# Patient Record
Sex: Male | Born: 1970 | Race: White | Hispanic: No | Marital: Married
Health system: Western US, Academic
[De-identification: ages and names within clinical notes are randomized; demographics above are authoritative.]

## PROBLEM LIST (undated history)

## (undated) DIAGNOSIS — I4891 Unspecified atrial fibrillation: Secondary | ICD-10-CM

## (undated) HISTORY — PX: ATRIAL ABLATION SURGERY: SHX560

---

## 2016-01-19 ENCOUNTER — Emergency Department
Admission: EM | Admit: 2016-01-19 | Discharge: 2016-01-19 | Disposition: A | Discharge status: Home / Self Care | Payer: Medicaid Other | Attending: Emergency Medicine | Admitting: Emergency Medicine

## 2016-01-19 ENCOUNTER — Encounter (HOSPITAL_COMMUNITY): Payer: Self-pay | Admitting: Emergency Medicine

## 2016-01-19 DIAGNOSIS — M7989 Other specified soft tissue disorders: Secondary | ICD-10-CM

## 2016-01-19 DIAGNOSIS — Y9289 Other specified places as the place of occurrence of the external cause: Secondary | ICD-10-CM | POA: Insufficient documentation

## 2016-01-19 DIAGNOSIS — M542 Cervicalgia: Principal | ICD-10-CM | POA: Insufficient documentation

## 2016-01-19 DIAGNOSIS — I878 Other specified disorders of veins: Secondary | ICD-10-CM

## 2016-01-19 DIAGNOSIS — T07 Unspecified multiple injuries: Secondary | ICD-10-CM

## 2016-01-19 DIAGNOSIS — Y9355 Activity, bike riding: Secondary | ICD-10-CM | POA: Insufficient documentation

## 2016-01-19 DIAGNOSIS — S70212A Abrasion, left hip, initial encounter: Secondary | ICD-10-CM | POA: Insufficient documentation

## 2016-01-19 DIAGNOSIS — S50312A Abrasion of left elbow, initial encounter: Secondary | ICD-10-CM | POA: Insufficient documentation

## 2016-01-19 DIAGNOSIS — M25522 Pain in left elbow: Secondary | ICD-10-CM

## 2016-01-19 DIAGNOSIS — M25552 Pain in left hip: Secondary | ICD-10-CM

## 2016-01-19 DIAGNOSIS — S40212A Abrasion of left shoulder, initial encounter: Secondary | ICD-10-CM | POA: Insufficient documentation

## 2016-01-19 DIAGNOSIS — Y998 Other external cause status: Secondary | ICD-10-CM | POA: Insufficient documentation

## 2016-01-19 DIAGNOSIS — M25512 Pain in left shoulder: Secondary | ICD-10-CM

## 2016-01-19 DIAGNOSIS — T148XXA Other injury of unspecified body region, initial encounter: Secondary | ICD-10-CM

## 2016-01-19 HISTORY — DX: Unspecified atrial fibrillation (CMS-HCC): I48.91

## 2016-01-19 MED ORDER — BACITRACIN 500 UNIT/GM EX OINT
TOPICAL_OINTMENT | Freq: Once | CUTANEOUS | Status: AC
Start: 2016-01-19 — End: 2016-01-19
  Administered 2016-01-19: 10:00:00 via TOPICAL
  Filled 2016-01-19: qty 2.7

## 2016-01-19 MED ORDER — KETOROLAC TROMETHAMINE 30 MG/ML IJ SOLN
30.0000 mg | Freq: Once | INTRAMUSCULAR | Status: DC
Start: 2016-01-19 — End: 2016-01-19

## 2016-01-19 MED ORDER — LACTATED RINGERS IV SOLN
Freq: Once | INTRAVENOUS | Status: AC
Start: 2016-01-19 — End: 2016-01-19
  Administered 2016-01-19: 10:00:00 via INTRAVENOUS
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
## 2016-01-19 NOTE — ED MD Progress Note (Signed)
ED Course:  Vital stable throughout the time in the emergency department.  Patient reports that his pain was also stable and he declined repeated offers for analgesia.  X-ray without evidence of acute fracture or dislocation.  Therefore discharged strict return precautions and plan to follow up with primary doctor in the coming week.  There was evidence of a small anterior effusion on his x-ray this elbow thus placed in arm sling for comfort but told to range joint as tolerated immediately.    Lab Results:  No results found for this or any previous visit.    Imaging Results:  X-ray Shoulder Complete Min 2 Views - Left - 01/19/2016 - IMPRESSION: No radiographic evidence of acute fracture or malalignment of the left shoulder.    X-ray Elbow 2 Views - Left - 01/19/2016 - IMPRESSION: No radiographic evidence of acute fracture or malalignment of the left elbow.    X-ray Hip Unilateral With Pelvis 2 Or 3 Views - Left - 01/19/2016 - IMPRESSION: No acute osseous abnormality.      All labs and study results were reviewed by myself and the Attending. Pertinent findings were discussed with the patient and his questions were answered. He agrees with this plan of care.

## 2016-01-19 NOTE — Discharge Instructions (Signed)
Arthralgia    You have been diagnosed with Arthralgia.    Arthralgia means pain and stiffness of the joints. People often describe the pain as aching or throbbing. Arthralgia can affect one or more joints. It can be caused by many types of conditions and/or injuries. Often, arthralgia lasts for a long time and people need treatment over months or years. Some causes of arthralgia are:     Infection with a virus.   Many types of infections that are starting to improve. When recovering from infection, sometimes there is joint pain.    Autoimmune diseases (where the body attacks itself). Examples are Lupus or Rheumatoid Arthritis.   Inflammation of the tendons or the fluid-filled sacs (bursa) surrounding your joints.   Low thyroid function.   Depression.     You might need another exam or more tests to find out why you have arthralgias. At this time, the cause of your symptoms does not seem dangerous. You do not need to stay in the hospital.    We dont believe your condition is dangerous right now. However, you need to be careful. Sometimes a problem that seems small can get serious later. This is why it is very important to come back here or go to the nearest Emergency Department unless you are much improved.    Clues that joint pain is dangerous are:     Hot and swollen joints. This may mean they are infected.   Fever (Temperature higher than 100.13F or 38C), weight loss and feeling very ill can be symptoms of severe infection (sepsis).   Severe pain, weakness or numbness (loss of feeling).    Some things you can try at home are:     Over-the-counter pain medications.   Heating pads and warm baths.   Physical therapy.    Follow the instructions for any medication you get prescribed.     Have a close follow-up with your primary care doctor.    YOU SHOULD SEEK MEDICAL ATTENTION IMMEDIATELY, EITHER HERE OR AT THE NEAREST EMERGENCY DEPARTMENT, IF ANY OF THE FOLLOWING OCCURS:     You have a  fever (temperature higher than 100.13F or 38C).   Your pain does not go away or gets worse.   The joint that hurts turns red and/or gets swollen.   You suddenly can't walk.   You don't feel better after treatment or feel you're getting worse.   You get any other symptoms, concerns, or don't get better as expected.    If you can't follow up with your doctor, or if at any time you feel you need to be rechecked or seen again, come back here or go to the nearest emergency department.        Abrasion    You have been diagnosed with an abrasion. This is a scrape of the outer skin layers.    Take off old dressings every day. Then put on a clean, dry dressing. If the dressing sticks to the wound, moisten it with water. This way, it can come off more easily.    Keep the wound clean and dry for the next 24 hours. You can wash the wound gently with soap and water. Then put on a dry bandage if needed, to protect it.    Put a thin layer of antibiotic ointment on the wound 2-3 times a day. This can be Polysporin / triple antibiotic. This can help prevent infection. It may help keep scarring to a minimum.  YOU SHOULD SEEK MEDICAL ATTENTION IMMEDIATELY, EITHER HERE OR AT THE NEAREST EMERGENCY DEPARTMENT, IF ANY OF THE FOLLOWING OCCURS:  · Unusual redness or swelling.  · There are red streaks going up the arm or leg.  · The wound smells bad or has a lot of drainage.  · Fever (temperature higher than 100.4ºF / 38ºC), chills, more pain and / or swelling.

## 2016-01-27 NOTE — ED Follow-up Note (Signed)
Follow-up type: Callback       Routine ED Patient Call Back    Patient unable to be contacted, no message left

## 2023-02-17 IMAGING — MR MRI THORACIC SPINE W/WO CONTRAST
7 of 13 series · 20 of 48 positions shown · IV contrast (gadolinium)
Comparison: None.

________________________________________________________________________________________________ 
MRI THORACIC SPINE W/WO CONTRAST, 02/17/2023 [DATE]: 
CLINICAL INDICATION: Thoracic spine pain
TECHNIQUE: Multiplanar, multiecho position MR images of the thoracic spine were 
performed without and with intravenous gadolinium enhancement. 8.5 mL of 
Gadavistwere injected intravenously by hand. 1.5 mL of Gadavist discarded. 
Patient was scanned on a 3T magnet.

[Series 102: T1 · sagittal · 5.5mm · 0.87mm/px · 1 of 15 slices shown]
[im 1/15]
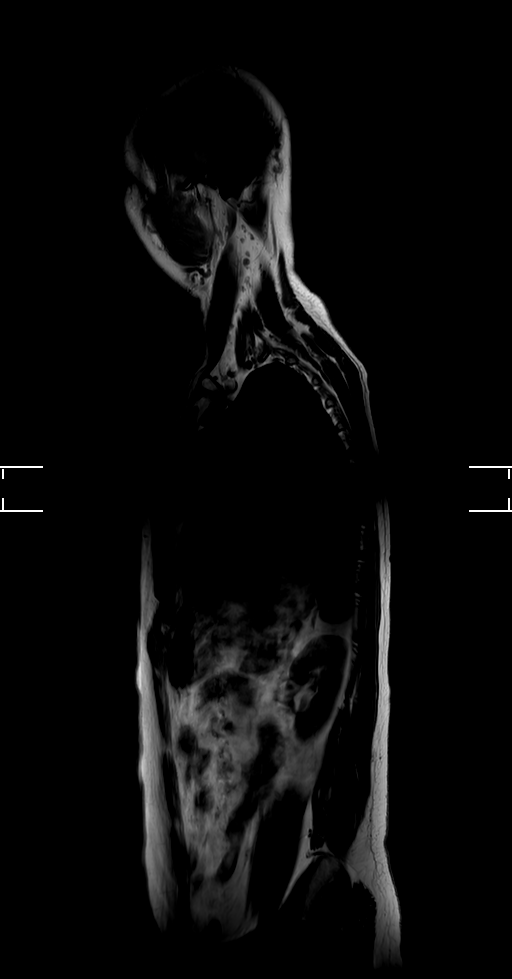

[Series 201: t2w_tse cor · coronal · 6.0mm · 0.51mm/px · 1 of 11 slices shown]
[im 1/11]
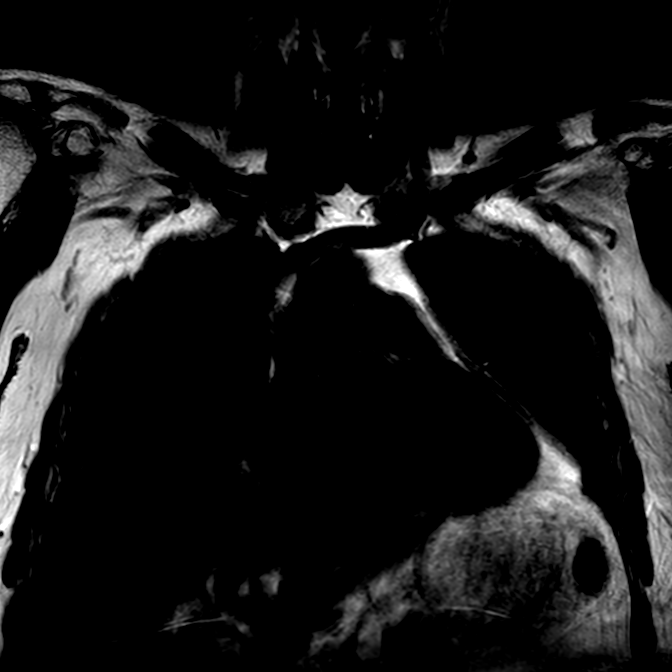

[Series 301: t1w_tse sag · sagittal · 3.0mm · 0.55mm/px · 2 of 21 slices shown]
[im 1/21]
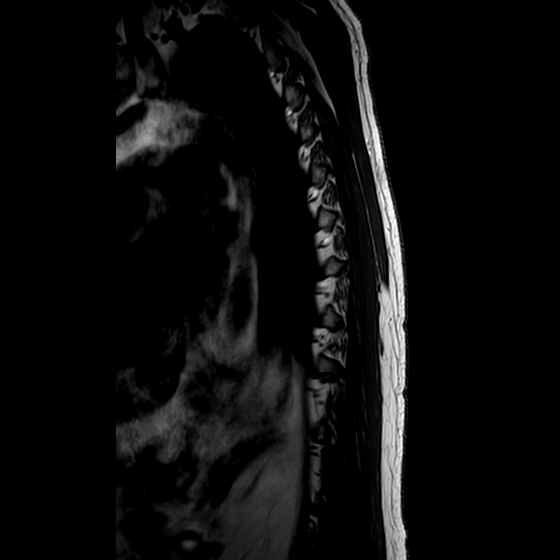
[im 21/21]
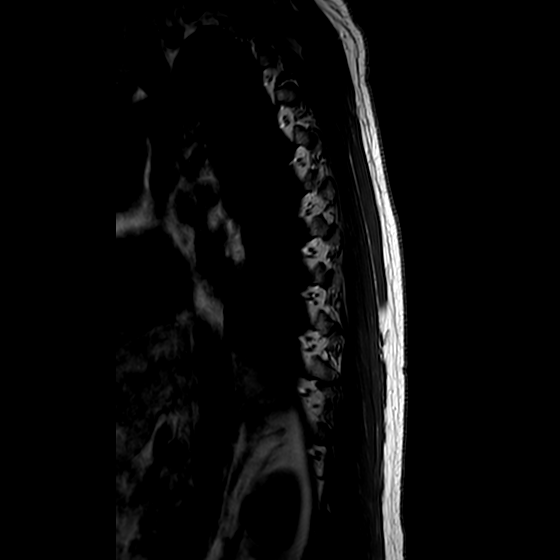

[Series 401: t2w_tse sag · sagittal · 3.0mm · 0.60mm/px · 2 of 21 slices shown]
[im 1/21]
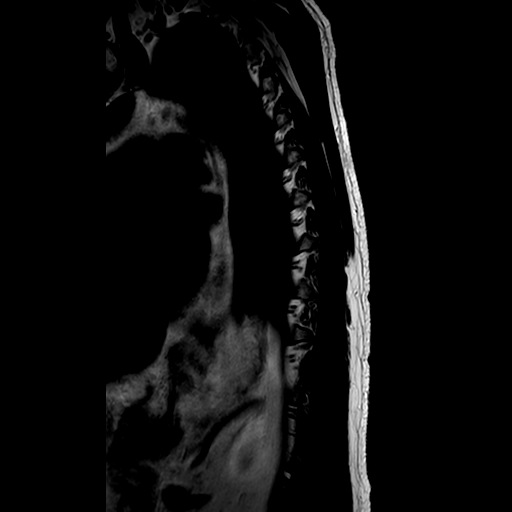
[im 21/21]
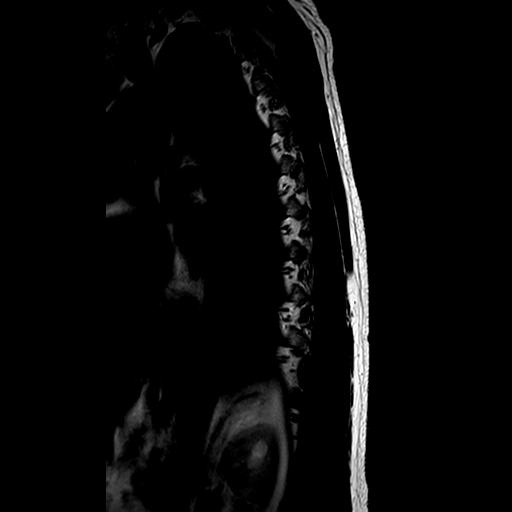

[Series 501: stir_longte · sagittal · 3.0mm · 0.71mm/px · 2 of 21 slices shown]
[im 1/21]
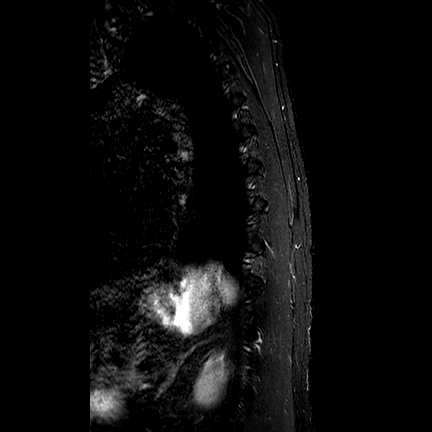
[im 21/21]
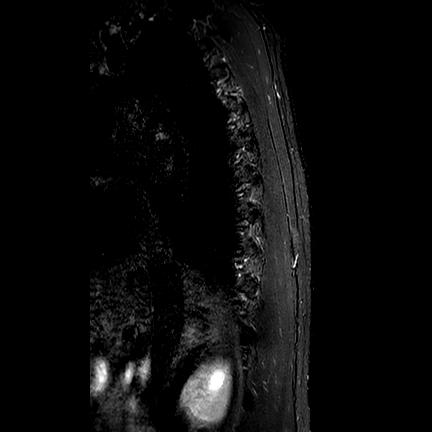

[Series 601: 3d_spine_view_t2 sag · sagittal · 1.4mm · 0.64mm/px · 7 of 100 slices shown]
[im 1/100]
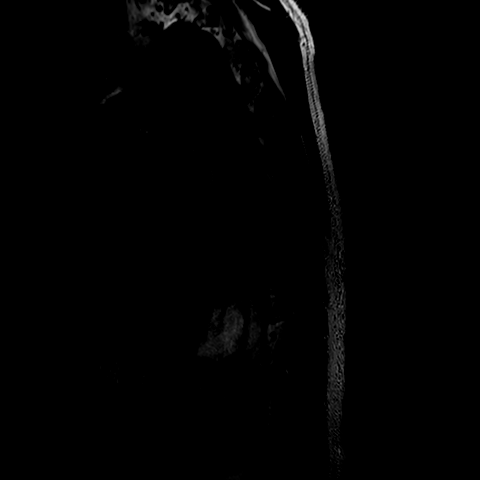
[im 15/100]
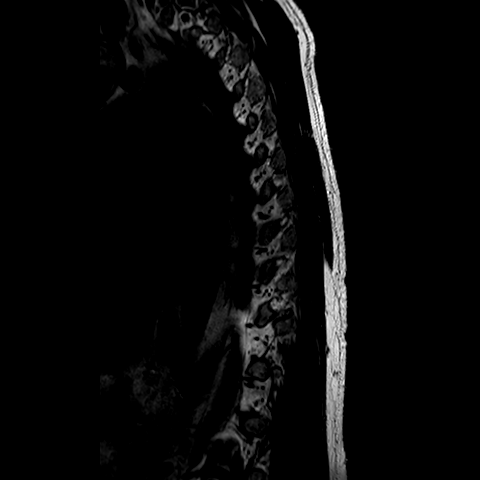
[im 29/100]
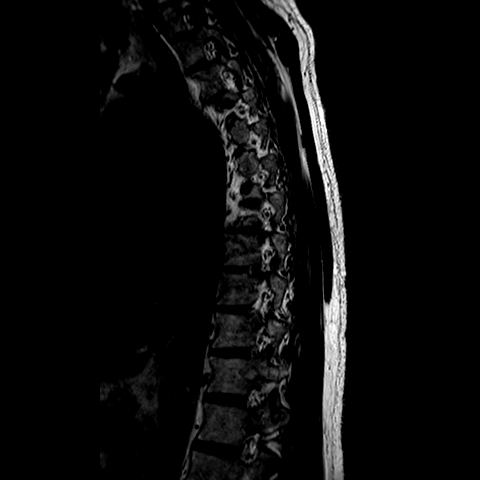
[im 43/100]
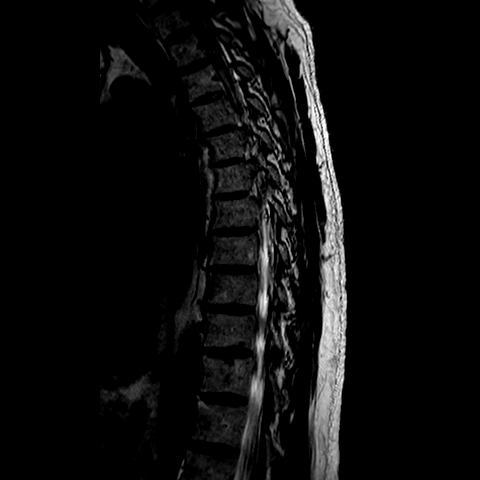
[im 57/100]
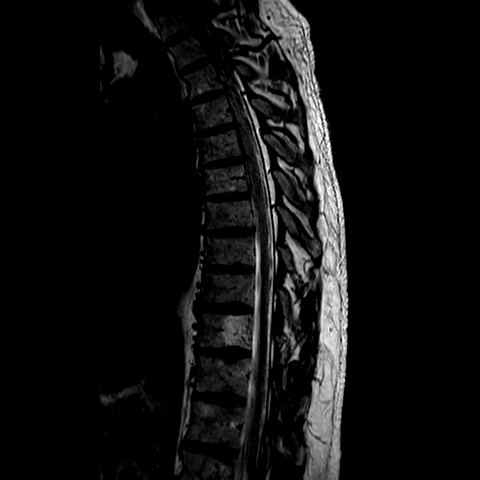
[im 71/100]
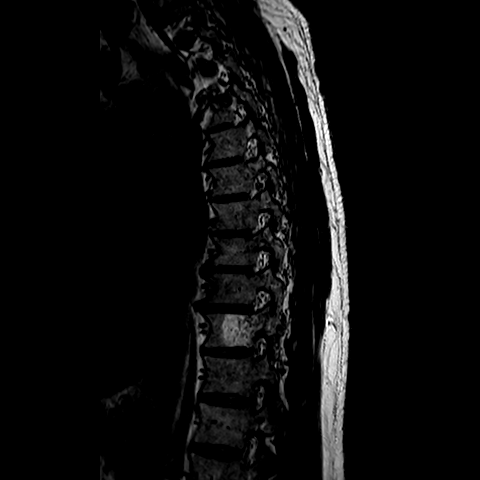
[im 85/100]
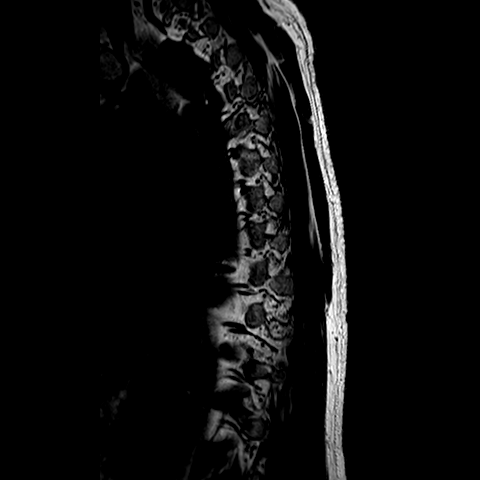

[Series 703: T2 · axial · 4.0mm · 0.42mm/px · z∈[-272,-3]mm · 5 of 66 slices shown]
[im 1/66]
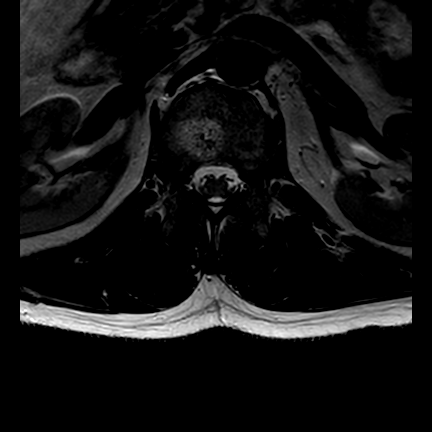
[im 17/66]
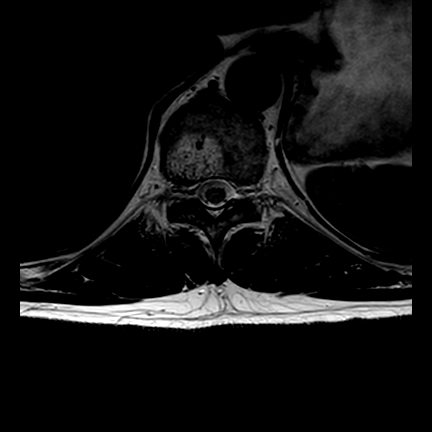
[im 33/66]
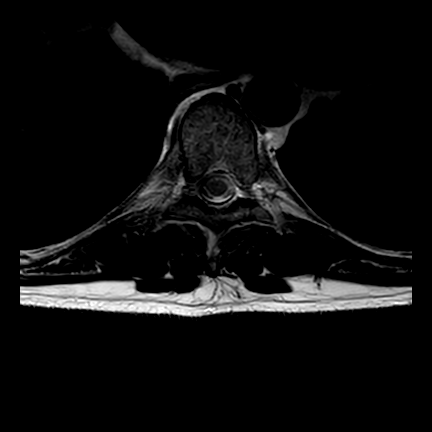
[im 49/66]
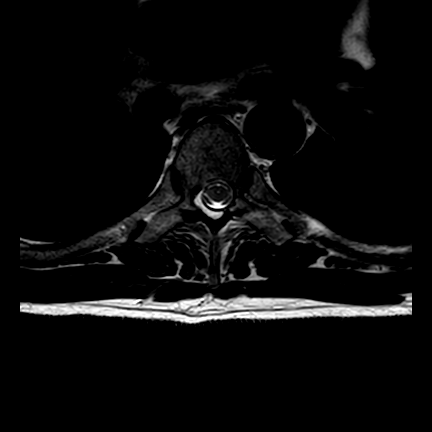
[im 66/66]
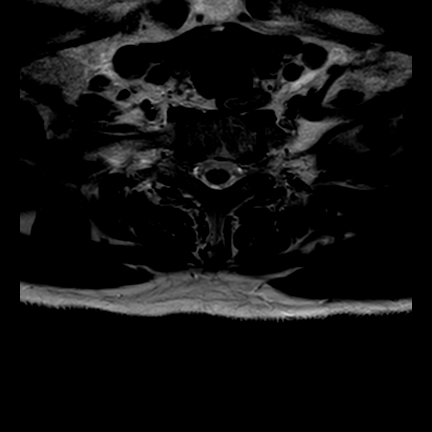

[20 of 48 positions shown; findings below may reference images not displayed]

FINDINGS: VERTEBRA: T10 vertebral body and right posterior element (pedicle, articulating 
processes, transverse process and portion of the lamina) hemangioma measures up 
to 5.0 cm in greatest dimensions, with posterior element marrow 
edema/enhancement and adjacent soft tissue swelling. Findings are consistent 
with stress reaction of the right T10 posterior elements and subtle nondisplaced 
fracture cannot be excluded. Multilevel marginal osteophytes. No central canal 
or neural foraminal stenosis. 12 degrees mid thoracic dextroscoliosis. 
DISCS: Multilevel degenerative disc disease. No disc protrusions or evidence of 
discitis.  
SPINAL CORD: Spinal cord is normal in caliber and signal intensity. Conus 
medullaris is at the level of L1.  
OTHER: Localizer views demonstrate mild degenerative change of the lumbar spine 
and 2.2 cm L1 vertebral body hemangioma. 1.2 cm right renal cyst.
IMPRESSION: 1.  T10 vertebral body and right posterior element hemangioma measures up to
cm, with posterior element marrow edema/enhancement and adjacent soft tissue 
swelling. Findings are consistent with stress reaction of the right T10 
posterior elements and subtle nondisplaced fracture cannot be excluded: CT of 
the thoracic spine without contrast may be helpful for further evaluation, if 
clinically indicated.  
2.  Multilevel degenerative change and 12 degrees mid thoracic dextroscoliosis.

## 2023-03-03 IMAGING — MR MRI RIGHT HAND WITHOUT CONTRAST
4 of 6 series · 20 of 40 positions shown · IV contrast (gadolinium)
Comparison: None.

________________________________________________________________________________________________ 
MRI RIGHT HAND WITHOUT CONTRAST, 03/03/2023 [DATE]: 
CLINICAL INDICATION: Pain In Right Wrist
TECHNIQUE: Multiplanar, multiecho position MR images of the hand were performed 
without intravenous gadolinium enhancement. Patient was scanned on a 3T magnet.

[Series 301: PD fat-sat · axial · right · 3.0mm · 0.27mm/px · z∈[-92,+76]mm · 8 of 49 slices shown (1 of 3)]
[im 1/49]
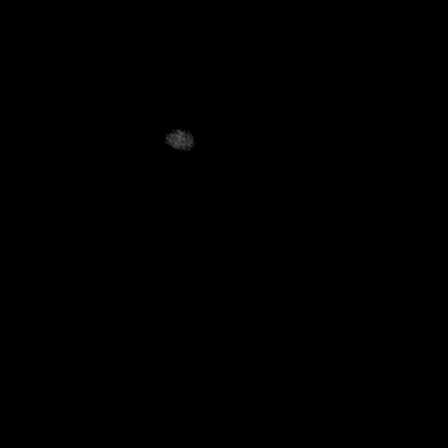
[im 6/49]
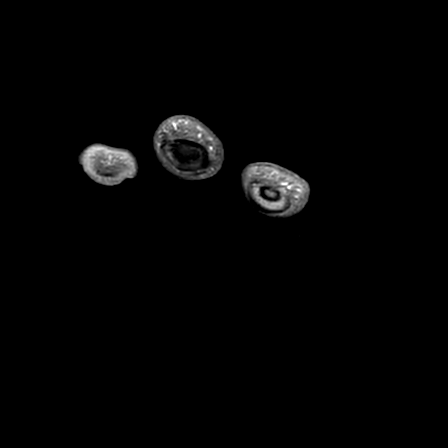
[im 17/49]
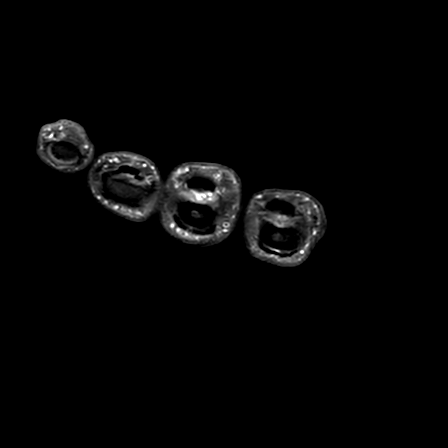
[im 22/49]
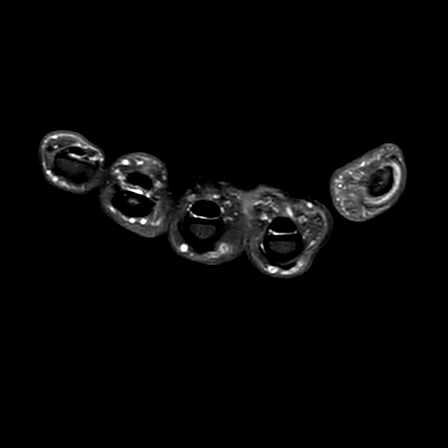
[im 27/49]
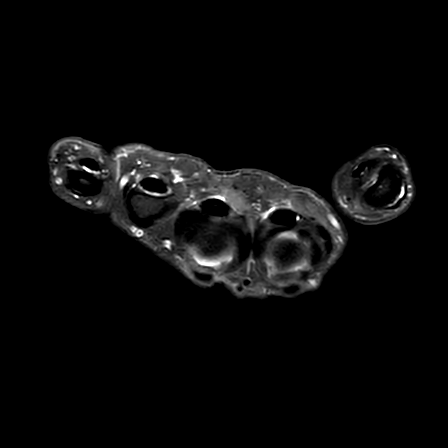
[im 33/49]
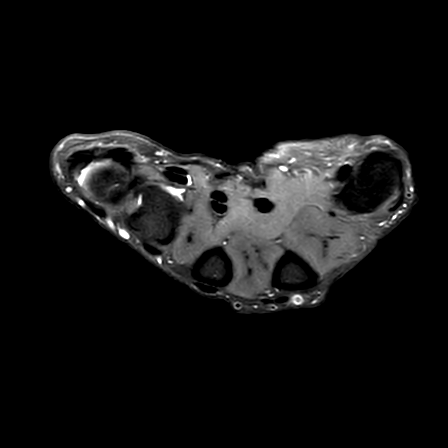
[im 43/49]
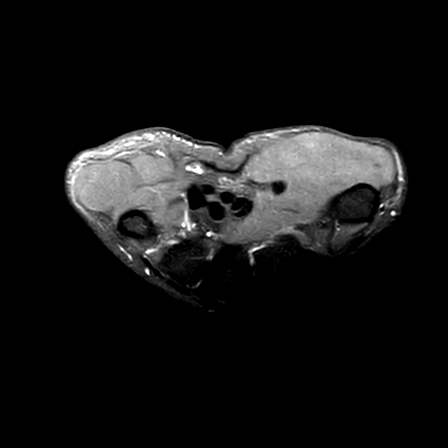
[im 49/49]
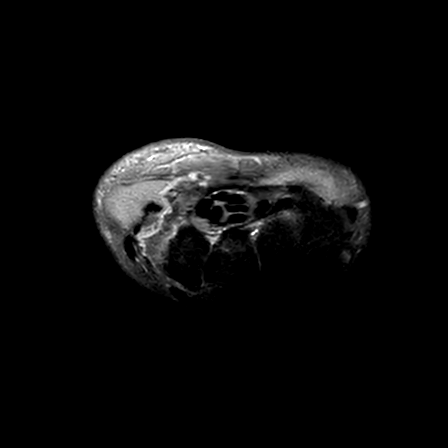

[Series 401: T1 · axial · right · 3.0mm · 0.21mm/px · z∈[-74,+55]mm · 3 of 49 slices shown]
[im 6/49]
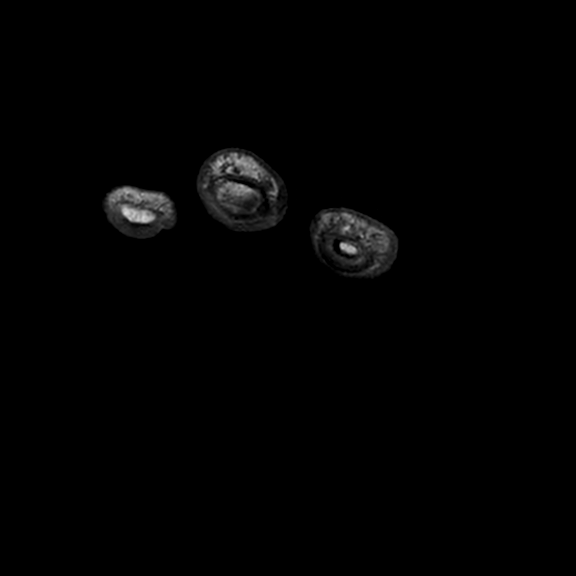
[im 27/49]
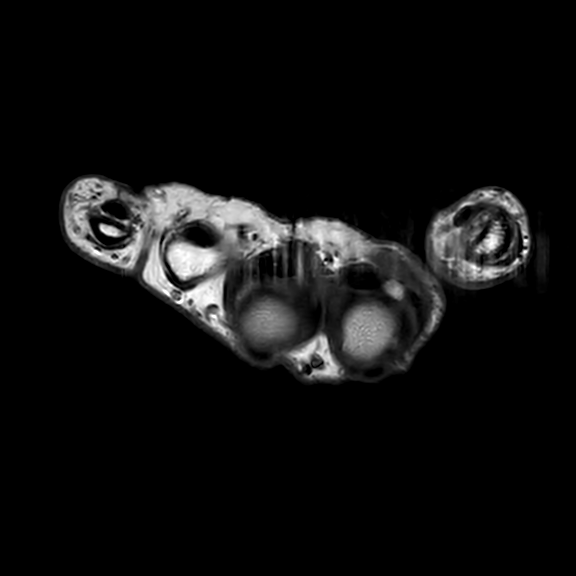
[im 43/49]
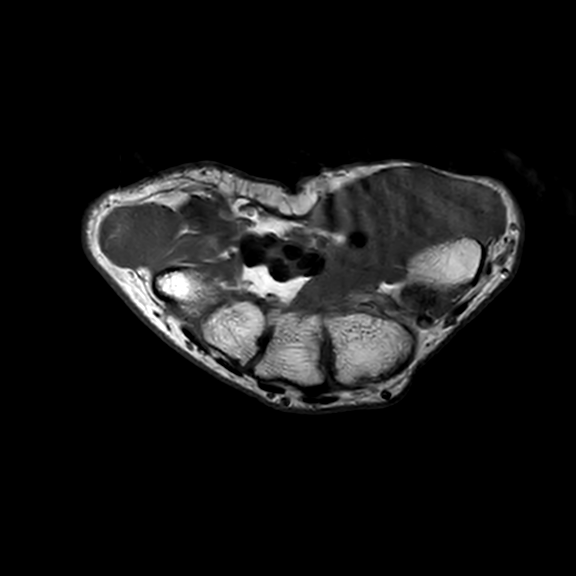

[Series 601: PD fat-sat · coronal · right · 2.0mm · 0.33mm/px · 6 of 29 slices shown (2 of 3)]
[im 1/29]
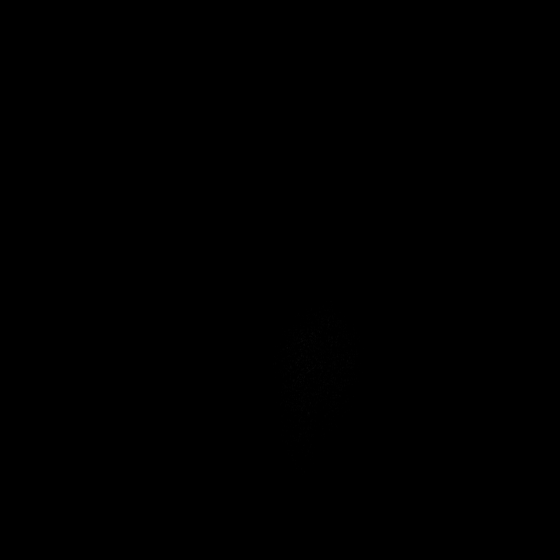
[im 6/29]
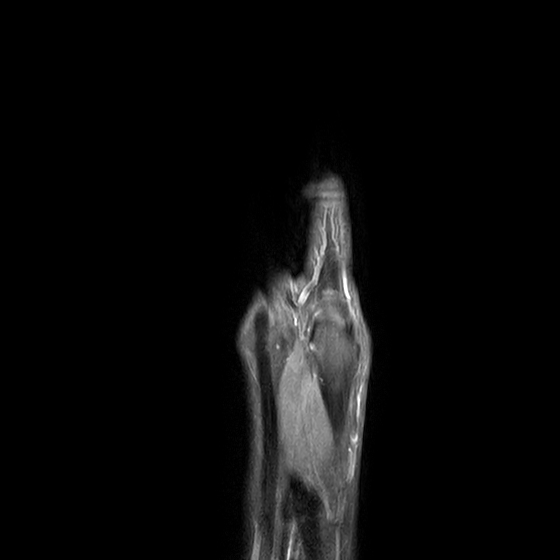
[im 12/29]
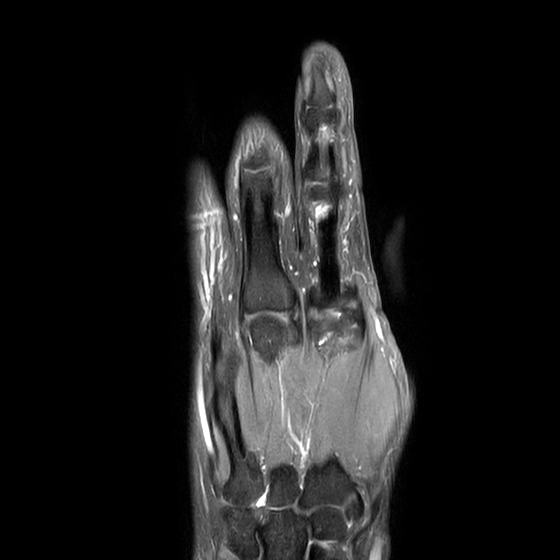
[im 17/29]
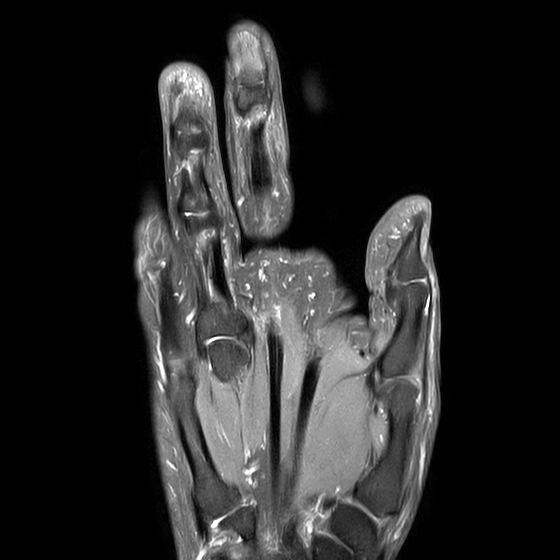
[im 23/29]
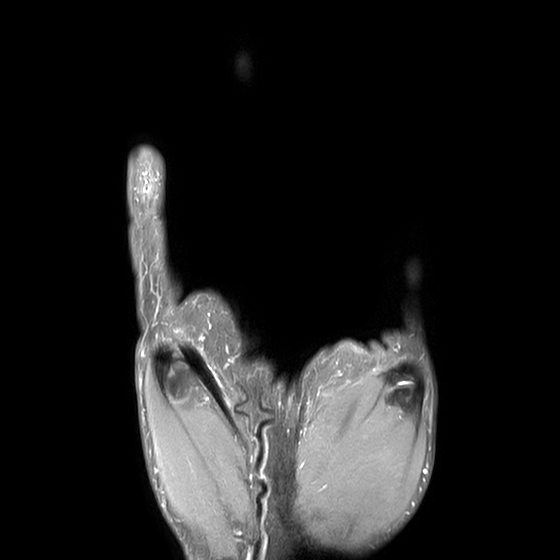
[im 29/29]
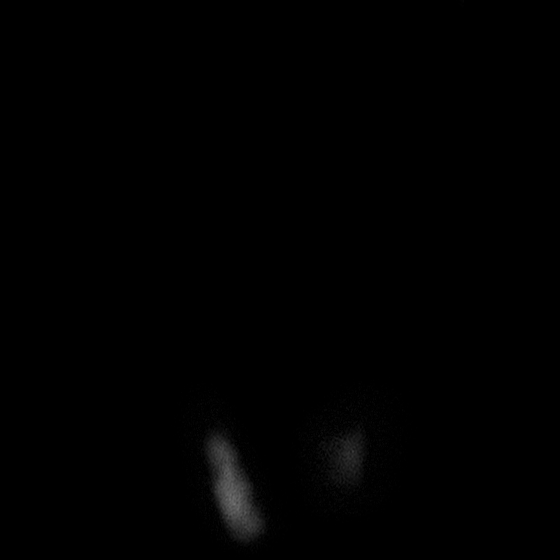

[Series 701: PD fat-sat · sagittal · right · 3.0mm · 0.33mm/px · 3 of 33 slices shown (3 of 3)]
[im 7/33]
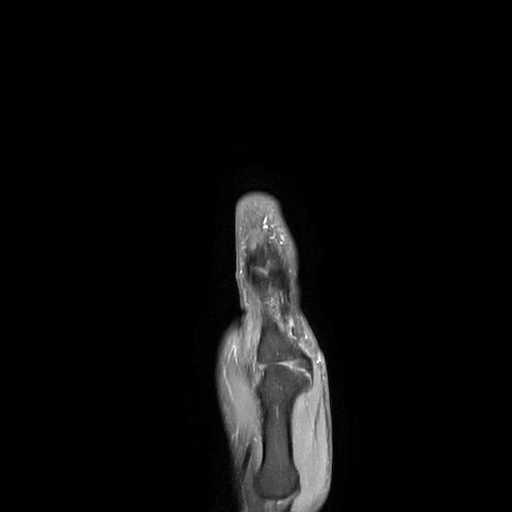
[im 20/33]
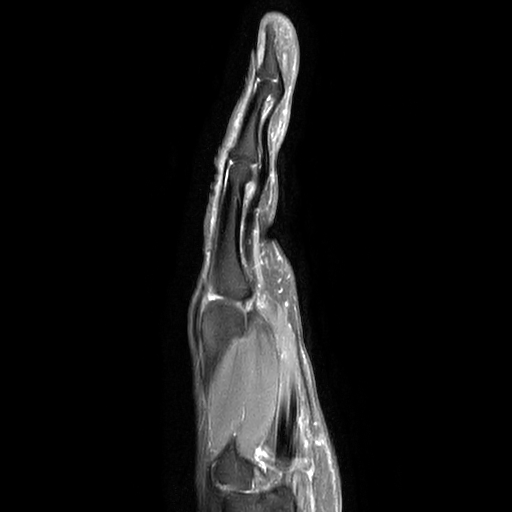
[im 33/33]
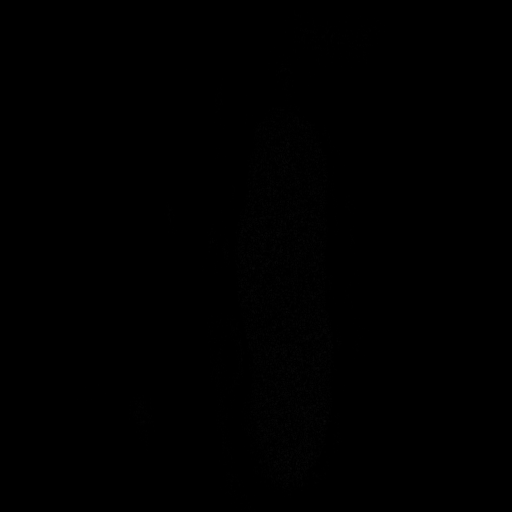

[20 of 40 positions shown; findings below may reference images not displayed]

FINDINGS: BONES AND JOINTS: Normal bone marrow signal intensity. No marrow edema, fracture 
or stress response. Articular cartilage is preserved. No joint effusion. No 
discrete synovitis.  
TENDONS: The imaged flexor and extensor tendons are preserved. No tendon tear, 
tenosynovitis or tendon subluxation. Annular pulleys, volar plates and extensor 
hoods appear preserved. 
SOFT TISSUES: Musculature is symmetric without mass, signal abnormality or 
atrophy. Neurovascular bundles are negative. No focal fluid collection or 
suggestion of ganglion.
IMPRESSION: Negative right hand. Please see MR exam the same day of the right wrist for 
discussion of the incomplete radial styloid fracture.

## 2023-03-03 IMAGING — MR MRI RIGHT WRIST WITHOUT CONTRAST
4 of 6 series · 17 of 40 positions shown · IV contrast (gadolinium)
Comparison: None.

________________________________________________________________________________________________ 
MRI RIGHT WRIST WITHOUT CONTRAST, 03/03/2023 [DATE]: 
CLINICAL INDICATION: Pain in the right wrist. History of right hand pain, thumb 
area and right wrist pain.
TECHNIQUE: Multiplanar, multiecho position MR images of the wrist were performed 
without intravenous gadolinium enhancement. Patient was scanned on a 3T magnet.

[Series 201: survey_right · coronal · 5.0mm · 0.89mm/px · 3 of 14 slices shown]
[im 1/14]
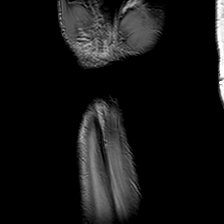
[im 7/14]
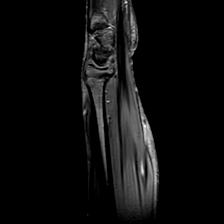
[im 14/14]
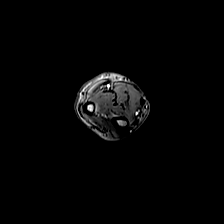

[Series 501: PD fat-sat · coronal · 2.0mm · 0.19mm/px · 7 of 25 slices shown (1 of 2)]
[im 1/25]
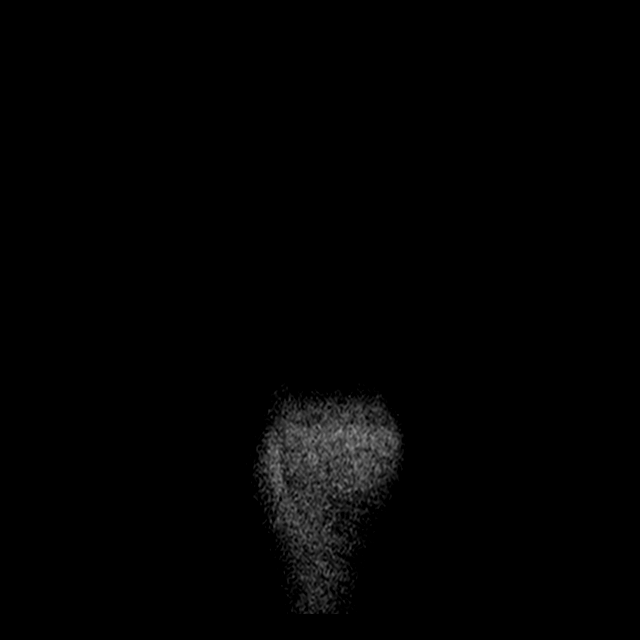
[im 5/25]
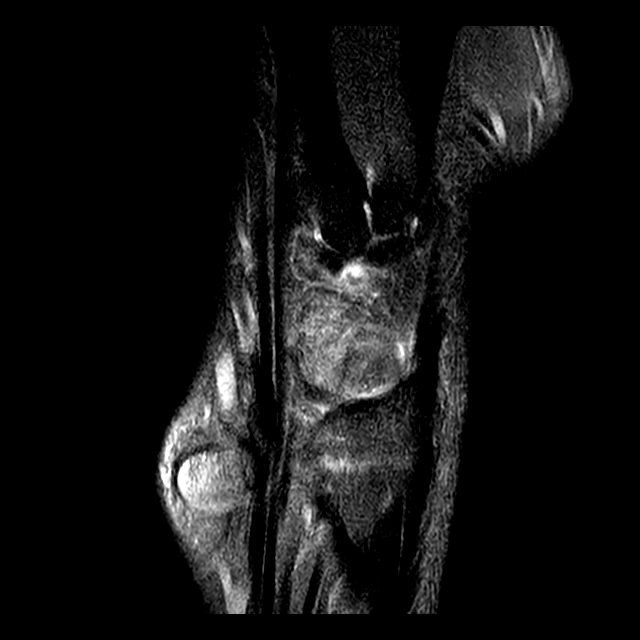
[im 9/25]
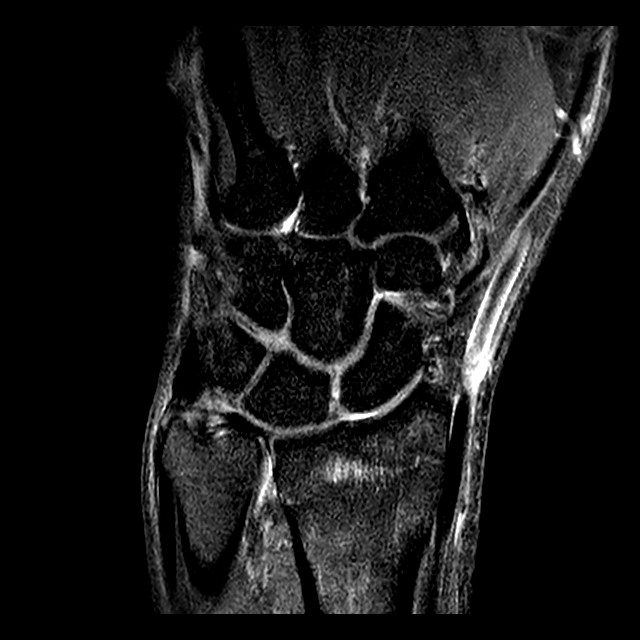
[im 13/25]
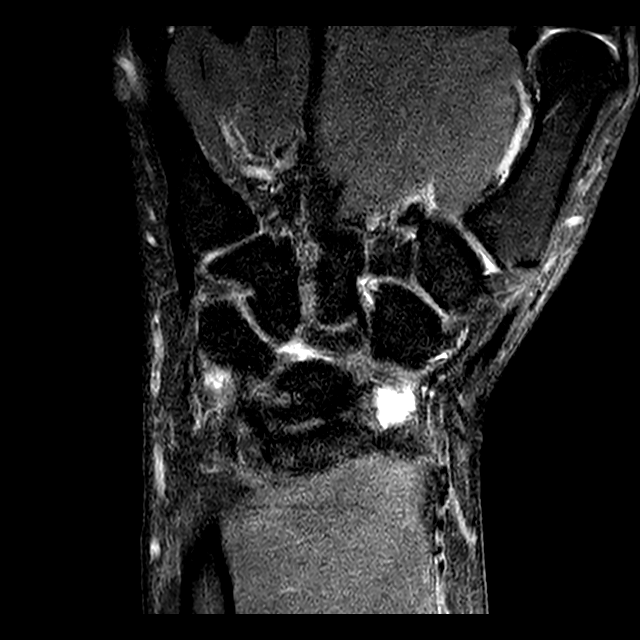
[im 17/25]
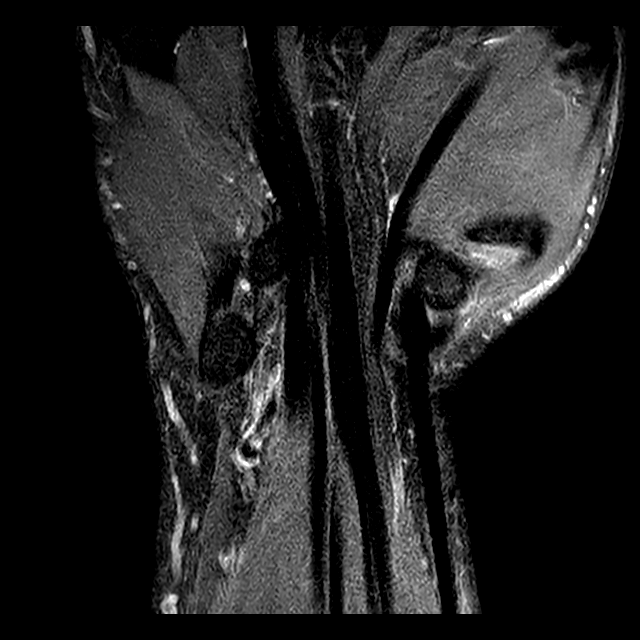
[im 21/25]
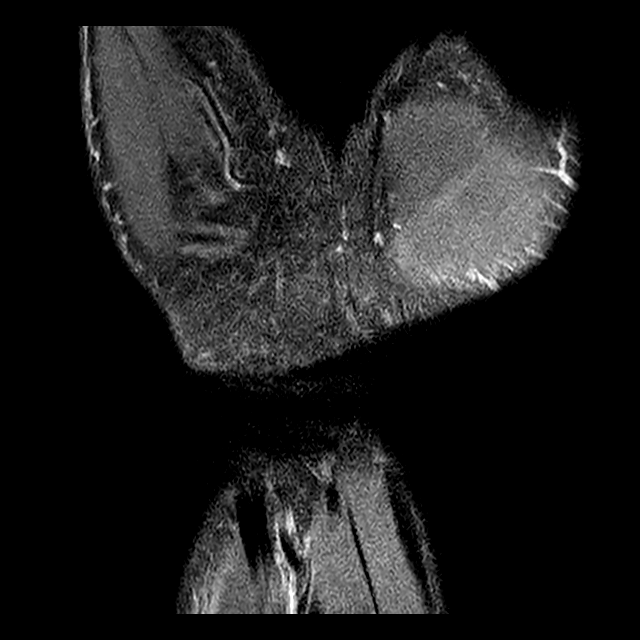
[im 25/25]
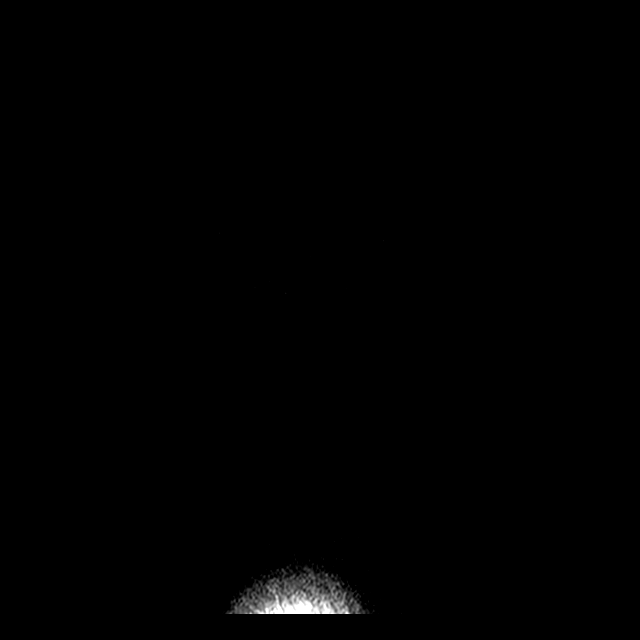

[Series 601: T1 · coronal · 2.0mm · 0.21mm/px · 3 of 25 slices shown]
[im 5/25]
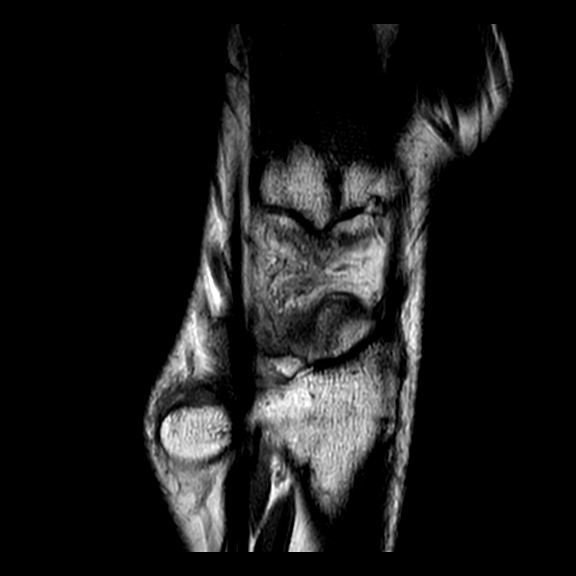
[im 13/25]
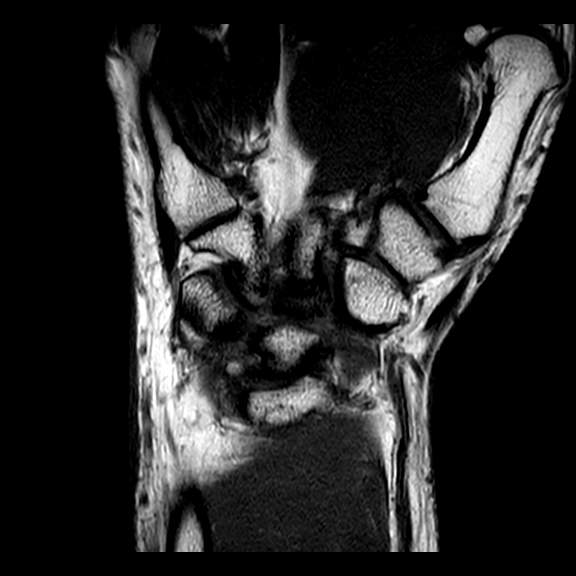
[im 21/25]
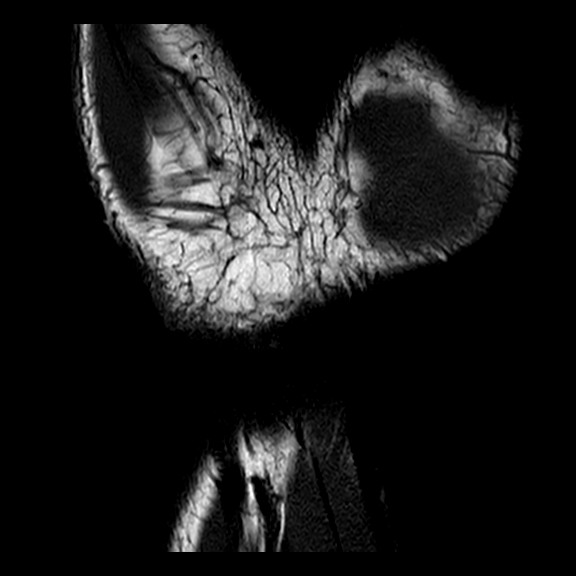

[Series 701: PD fat-sat · sagittal · 3.0mm · 0.23mm/px · 4 of 31 slices shown (2 of 2)]
[im 1/31]
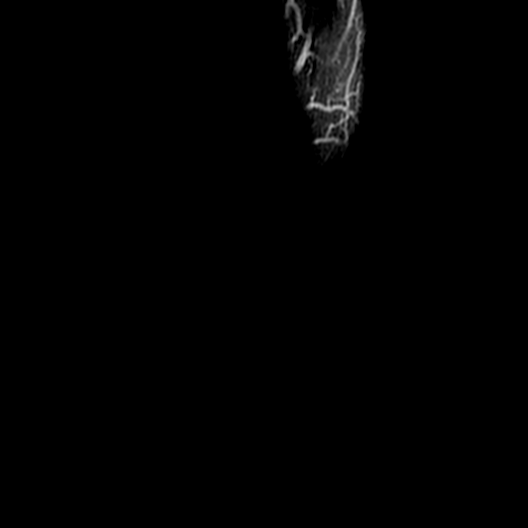
[im 4/31]
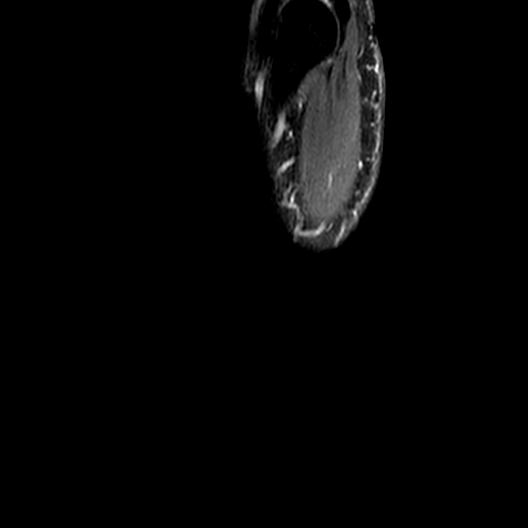
[im 16/31]
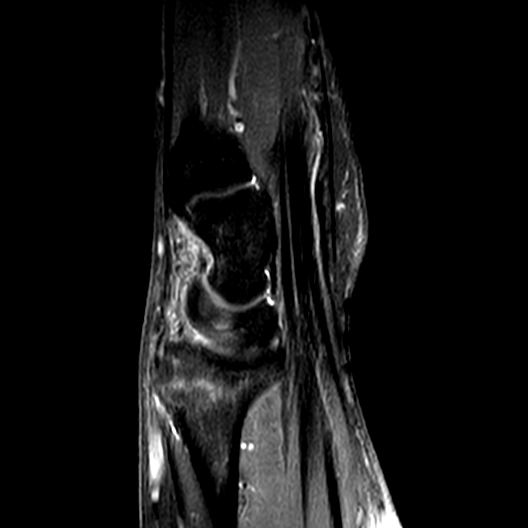
[im 27/31]
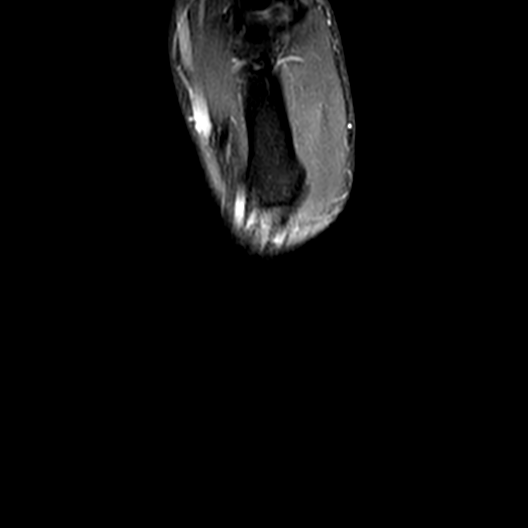

[17 of 40 positions shown; findings below may reference images not displayed]

FINDINGS: BONES AND JOINTS: There is marrow edema with transverse incomplete fracture of 
the radial styloid. No additional fracture. Mild scattered articular 
cartilaginous loss. Small effusion. Normal alignment. Specifically, the distal 
ulna as well located within the radial sigmoid notch. 
LIGAMENTS: The scapholunate ligament is preserved. The lunotriquetral ligament 
is preserved. The central disc of the TFCC is preserved. The volar and dorsal 
components of the radioulnar ligament of the TFCC are intact. The ulnocarpal 
components of the TFCC are intact. The extrinsic ligaments are grossly intact.  
TENDONS: The flexor and extensor tendons are preserved. No tendinosis, tear or 
tenosynovitis. The flexor retinaculum is not bowed. 
SOFT TISSUES: Musculature is symmetric without mass, signal abnormality or 
atrophy. Neurovascular bundles are negative. Specifically, the ulnar nerve is 
negative at the level of [place] canal. No focal fluid collection or suggestion of 
ganglion.
IMPRESSION: 1.  Incomplete radial styloid fracture. 
2.  Mild degenerative changes

## 2023-03-06 IMAGING — CT CT THORACIC SPINE WITHOUT CONTRAST
3 series · 9 of 33 positions shown, 11 images · non-contrast
Comparison: MRI thoracic spine from February 17, 2023.

________________________________________________________________________________________________ 
CT THORACIC SPINE WITHOUT CONTRAST, 03/06/2023 [DATE]: 
CLINICAL INDICATION: Right-sided low back pain for 2 years. Recent MRI showed 
T10 hemangioma with associated edema/enhancement and adjacent soft tissue 
swelling. 
A search for DICOM formatted images was conducted for prior CT imaging studies 
completed at a non-affiliated media free facility.
TECHNIQUE: The thoracic spine was scanned from C7 through L1 vertebra without 
contrast on a high-resolution CT scanner using dose reduction techniques. 
Routine MPR images were performed.

[Series 5: t spine 2.0 b31s · axial · 0.34mm/px · z∈[-1,-1]mm · 1 of 188 slices shown, 2 images]
[im 101/188  soft-tissue]
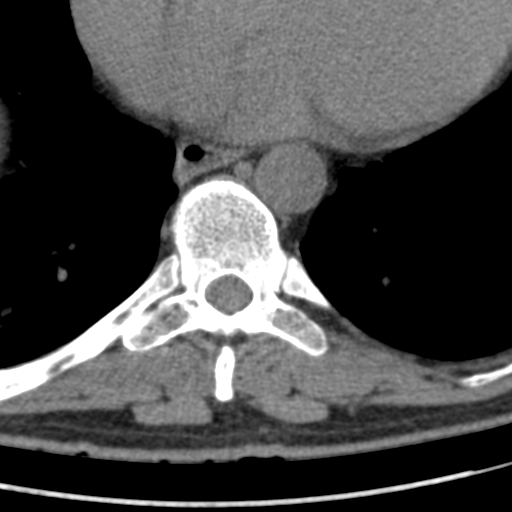
[im 101/188  bone]
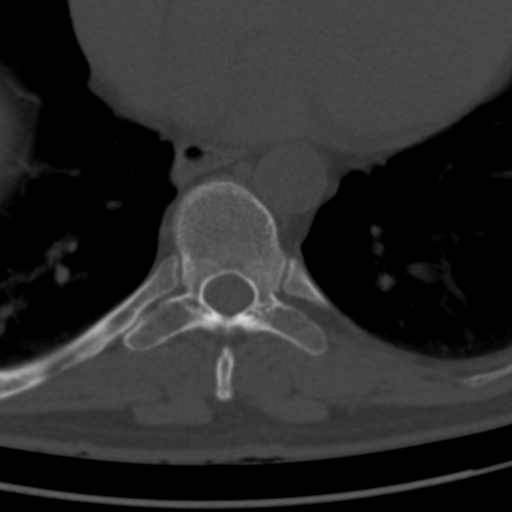

[Series 7: t spine coronal · coronal · 0.40mm/px · 3 of 73 slices shown]
[im 15/73  bone]
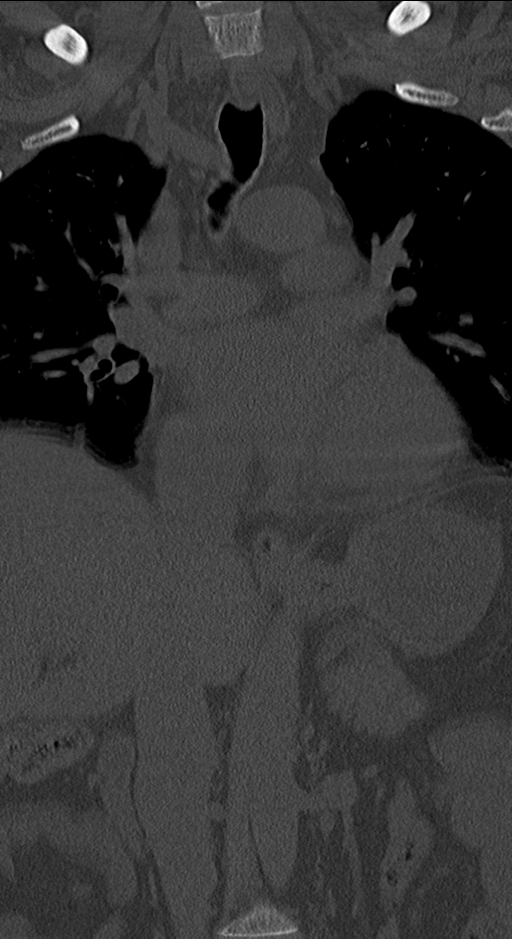
[im 29/73  bone]
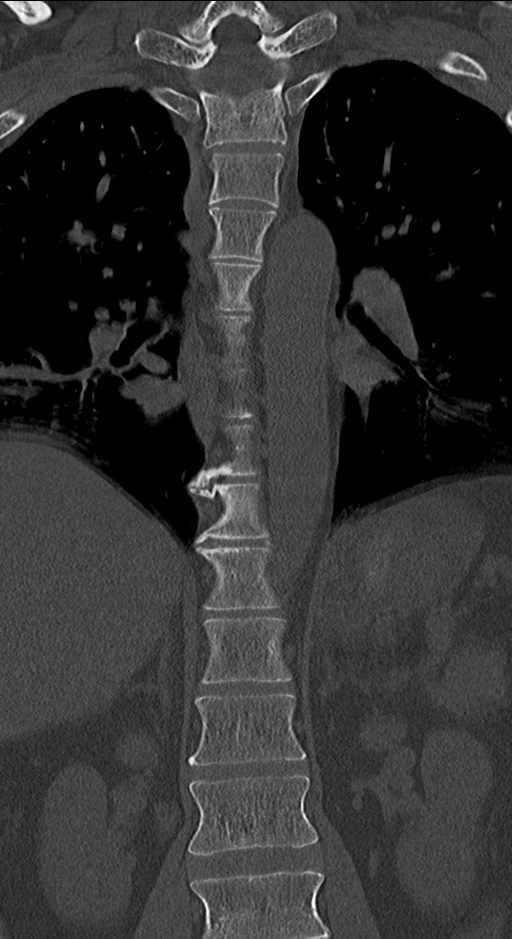
[im 44/73  bone]
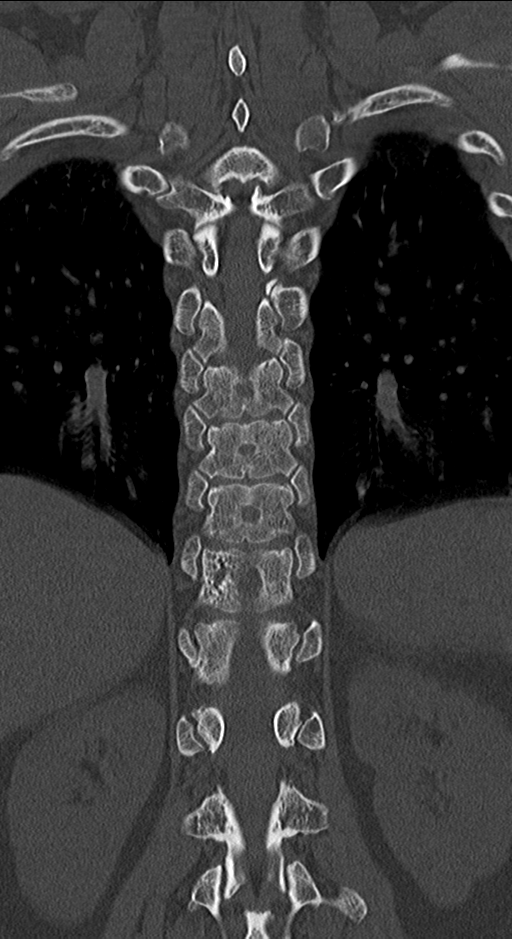

[Series 10: sag · sagittal · 0.36mm/px · 5 of 84 slices shown, 6 images]
[im 28/84  bone]
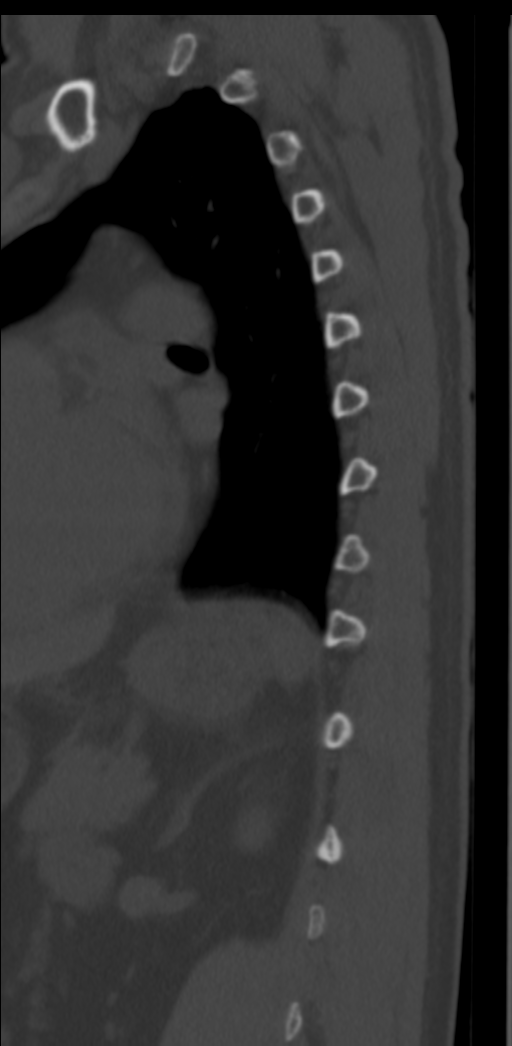
[im 35/84  bone]
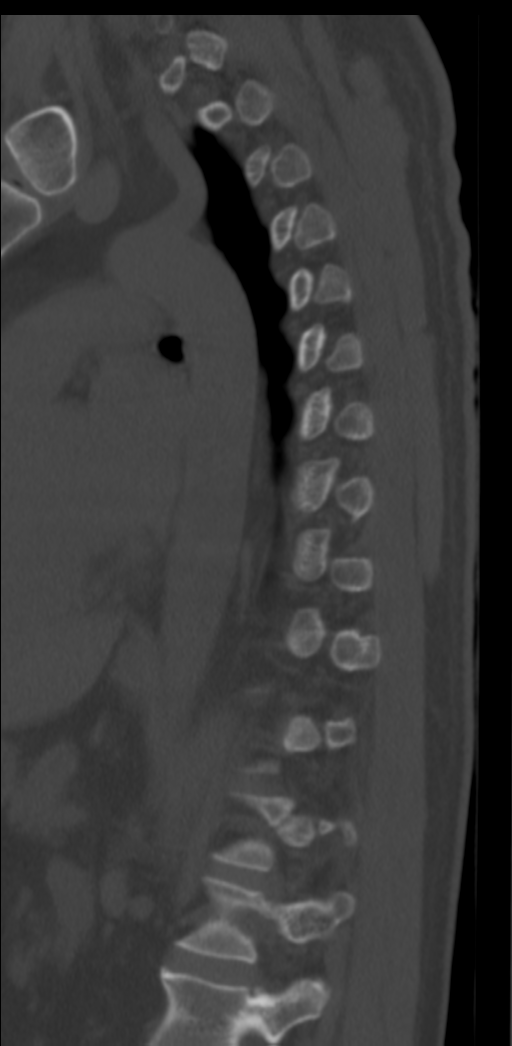
[im 42/84  soft-tissue]
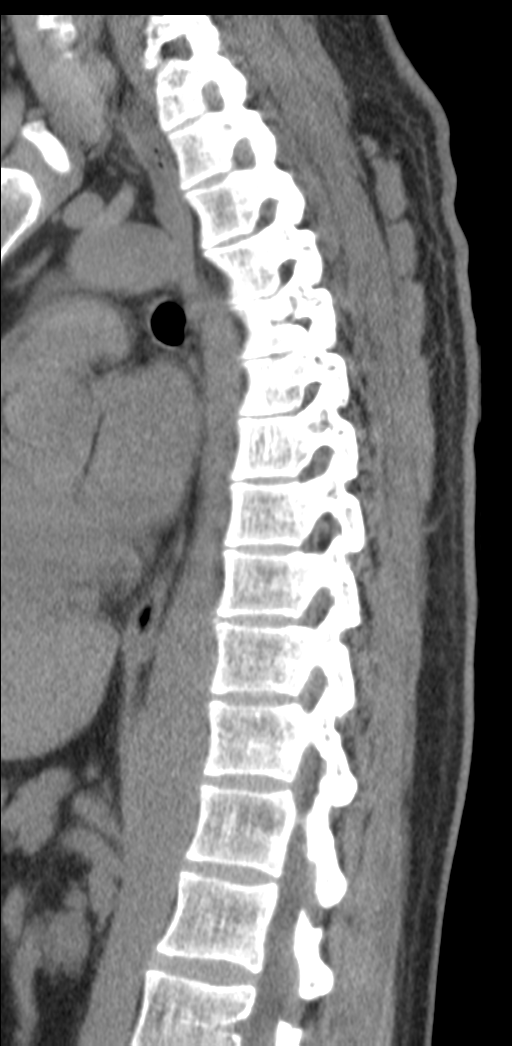
[im 42/84  bone]
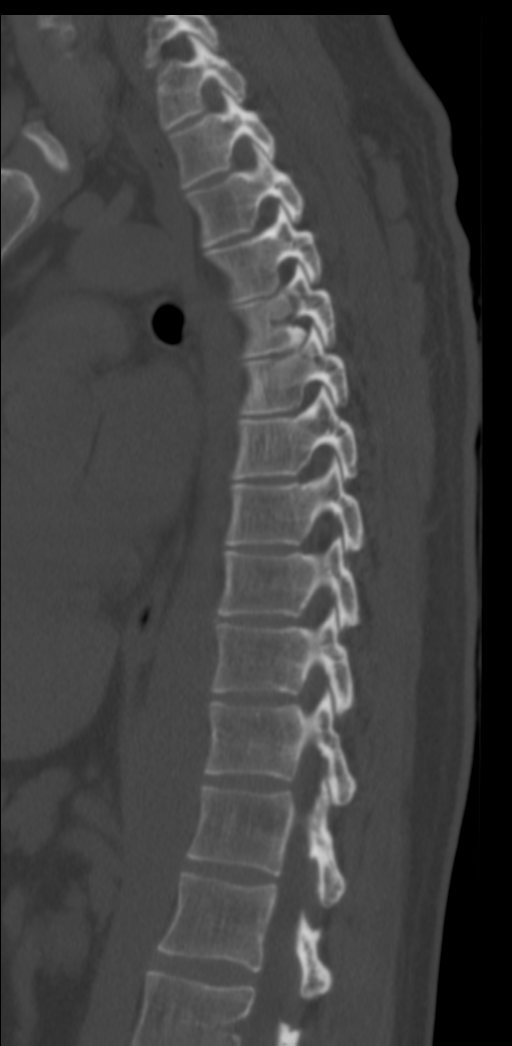
[im 49/84  bone]
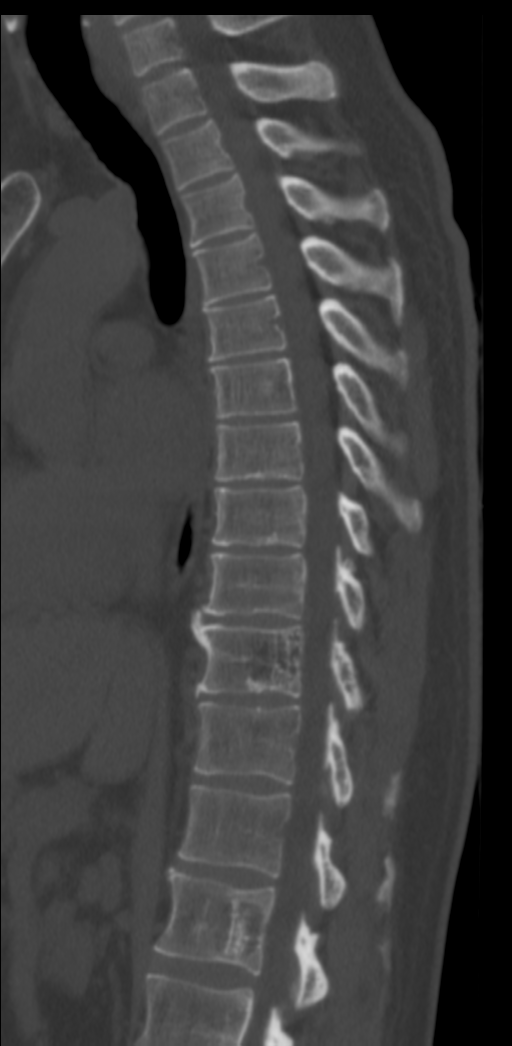
[im 56/84  bone]
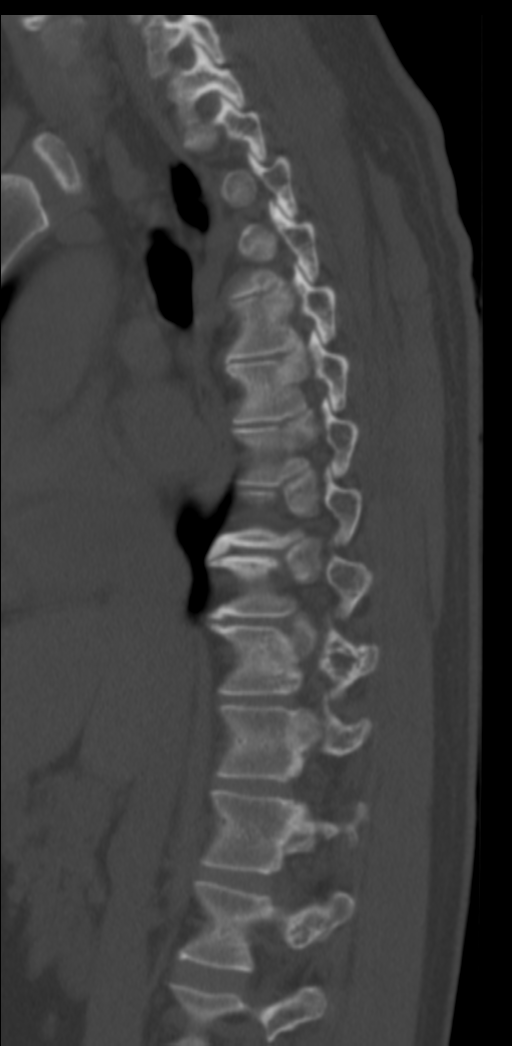

[9 of 33 positions shown; findings below may reference images not displayed]

Count of known CT and Cardiac Nuclear Medicine studies performed in the previous 
12 months = 0.
FINDINGS: Once again at T10 level along the right posterior margin of the vertebral body 
is a hemangioma. This extends into the pedicle as well as the articular process 
and further into the transverse process of T10. There is a focal bony defect 
along the posterior margin of pars interarticularis from cortical breakthrough 
of the hemangioma. No acute fracture is seen. 
Additional hemangioma noted within L1 vertebral body posteriorly. 
Alignment shows no focal subluxation. Visualized extraspinal soft tissues and 
osseous structures are unremarkable. Segmental analysis reveals no significant 
central canal or neural foraminal narrowing at any level on CT.
IMPRESSION: T10 hemangioma with cortical breakthrough along the posterior margin of right 
T10 pars interarticularis. 
RADIATION DOSE REDUCTION: All CT scans are performed using radiation dose 
reduction techniques, when applicable.  Technical factors are evaluated and 
adjusted to ensure appropriate moderation of exposure.  Automated dose 
management technology is applied to adjust the radiation doses to minimize 
exposure while achieving diagnostic quality images.
